# Patient Record
Sex: Male | Born: 1980 | Race: White
Health system: Southern US, Community
[De-identification: ages and names within clinical notes are randomized; demographics above are authoritative.]

## PROBLEM LIST (undated history)

## (undated) DIAGNOSIS — N200 Calculus of kidney: Secondary | ICD-10-CM

---

## 2019-01-04 ENCOUNTER — Encounter (HOSPITAL_COMMUNITY): Payer: Self-pay

## 2019-01-04 ENCOUNTER — Other Ambulatory Visit: Payer: Self-pay

## 2019-01-04 ENCOUNTER — Emergency Department (HOSPITAL_COMMUNITY): Payer: Self-pay

## 2019-01-04 ENCOUNTER — Emergency Department (HOSPITAL_COMMUNITY)
Admission: EM | Admit: 2019-01-04 | Discharge: 2019-01-04 | Disposition: A | Discharge status: Home / Self Care | Payer: Self-pay | Attending: Emergency Medicine | Admitting: Emergency Medicine

## 2019-01-04 DIAGNOSIS — R200 Anesthesia of skin: Secondary | ICD-10-CM | POA: Insufficient documentation

## 2019-01-04 DIAGNOSIS — R0600 Dyspnea, unspecified: Secondary | ICD-10-CM | POA: Insufficient documentation

## 2019-01-04 DIAGNOSIS — F17210 Nicotine dependence, cigarettes, uncomplicated: Secondary | ICD-10-CM | POA: Insufficient documentation

## 2019-01-04 LAB — CBC WITH DIFFERENTIAL/PLATELET
Abs Immature Granulocytes: 0.01 10*3/uL (ref 0.00–0.07)
Basophils Absolute: 0.1 10*3/uL (ref 0.0–0.1)
Basophils Relative: 1 %
Eosinophils Absolute: 0.1 10*3/uL (ref 0.0–0.5)
Eosinophils Relative: 1 %
HCT: 42.8 % (ref 39.0–52.0)
Hemoglobin: 13.7 g/dL (ref 13.0–17.0)
Immature Granulocytes: 0 %
Lymphocytes Relative: 26 %
Lymphs Abs: 2.1 10*3/uL (ref 0.7–4.0)
MCH: 28.9 pg (ref 26.0–34.0)
MCHC: 32 g/dL (ref 30.0–36.0)
MCV: 90.3 fL (ref 80.0–100.0)
Monocytes Absolute: 0.3 10*3/uL (ref 0.1–1.0)
Monocytes Relative: 4 %
Neutro Abs: 5.6 10*3/uL (ref 1.7–7.7)
Neutrophils Relative %: 68 %
Platelets: 230 10*3/uL (ref 150–400)
RBC: 4.74 MIL/uL (ref 4.22–5.81)
RDW: 13 % (ref 11.5–15.5)
WBC: 8.2 10*3/uL (ref 4.0–10.5)
nRBC: 0 % (ref 0.0–0.2)

## 2019-01-04 LAB — COMPREHENSIVE METABOLIC PANEL
ALT: 9 U/L (ref 0–44)
AST: 15 U/L (ref 15–41)
Albumin: 4.4 g/dL (ref 3.5–5.0)
Alkaline Phosphatase: 91 U/L (ref 38–126)
Anion gap: 13 (ref 5–15)
BUN: 8 mg/dL (ref 6–20)
CO2: 20 mmol/L — ABNORMAL LOW (ref 22–32)
Calcium: 8.9 mg/dL (ref 8.9–10.3)
Chloride: 107 mmol/L (ref 98–111)
Creatinine, Ser: 0.9 mg/dL (ref 0.61–1.24)
GFR calc Af Amer: >60 mL/min (ref >60)
GFR calc non Af Amer: >60 mL/min (ref >60)
Glucose, Bld: 93 mg/dL (ref 70–99)
Potassium: 3.7 mmol/L (ref 3.5–5.1)
Sodium: 140 mmol/L (ref 135–145)
Total Bilirubin: 0.5 mg/dL (ref 0.3–1.2)
Total Protein: 7.6 g/dL (ref 6.5–8.1)

## 2019-01-04 LAB — BRAIN NATRIURETIC PEPTIDE: B Natriuretic Peptide: 20.8 pg/mL (ref 0.0–100.0)

## 2019-01-04 LAB — TROPONIN I (HIGH SENSITIVITY)
Troponin I (High Sensitivity): 2 ng/L (ref <18)
Troponin I (High Sensitivity): 3 ng/L (ref <18)

## 2019-01-04 LAB — D-DIMER, QUANTITATIVE: D-Dimer, Quant: <0.27 ug/mL-FEU (ref 0.00–0.50)

## 2019-01-04 MED ORDER — KETOROLAC TROMETHAMINE 30 MG/ML IJ SOLN
30.0000 mg | Freq: Once | INTRAMUSCULAR | Status: AC
  Administered 2019-01-04: 30 mg via INTRAVENOUS
  Filled 2019-01-04: qty 1

## 2019-01-04 MED ORDER — SODIUM CHLORIDE (PF) 0.9 % IJ SOLN
INTRAMUSCULAR | Status: AC
  Administered 2019-01-04: 18:00:00 10 mL
  Filled 2019-01-04: qty 50

## 2019-01-04 MED ORDER — HYDROCODONE-ACETAMINOPHEN 5-325 MG PO TABS: 1.0000 | ORAL_TABLET | Freq: Four times a day (QID) | ORAL | 0 refills | Status: AC | PRN

## 2019-01-04 MED ORDER — IOHEXOL 350 MG/ML SOLN
100.0000 mL | Freq: Once | INTRAVENOUS | Status: AC | PRN
  Administered 2019-01-04: 100 mL via INTRAVENOUS

## 2019-01-04 MED ORDER — MORPHINE SULFATE (PF) 4 MG/ML IV SOLN
4.0000 mg | Freq: Once | INTRAVENOUS | Status: AC
  Administered 2019-01-04: 17:00:00 4 mg via INTRAVENOUS
  Filled 2019-01-04: qty 1

## 2019-01-04 MED ORDER — PREDNISONE 10 MG PO TABS: 20.0000 mg | ORAL_TABLET | Freq: Two times a day (BID) | ORAL | 0 refills | Status: AC

## 2019-01-04 NOTE — Discharge Instructions (Signed)
Prednisone as prescribed.  Hydrocodone as prescribed as needed for pain.  Return to the emergency department if you develop worsening breathing, severe chest pain, high fever, or other new and concerning symptoms.  The radiologist is recommending you have outpatient follow-up with a pulmonologist to further evaluate the abnormal finding on your CT scan.  The contact information for the pulmonology clinic has been provided in this discharge summary for you to call and make these arrangements.

## 2019-01-04 NOTE — ED Triage Notes (Signed)
He c/o shortness of breath and a "weird feeling of tingling starting at my left shoulder going down my left arm to my hand". His skin is normal, warm and dry and he is in no distress. EKG performed at triage.

## 2019-01-04 NOTE — ED Provider Notes (Signed)
[facility]-EMERGENCY DEPT Provider Note   CSN: [id number] Arrival date & time: 01/04/19  1127     History   Chief Complaint Chief Complaint  Patient presents with  . Shortness of Breath    HPI Travis Nelson is a 38 y.o. male.     Patient is a 38 year old male with no past medical history taking no medications.  He presents today for evaluation of chest discomfort and shortness of breath.  This began yesterday while he was outside smoking a cigarette.  He states that he suddenly began to feel short of breath along with numbness to the left chest and a tingling sensation going down his left arm.  He denies any nausea, diaphoresis, fevers, chills, or cough.  He denies any ill contacts.  He denies any swelling in his legs.  Patient has no prior cardiac history but does have cardiac risk factors of family history and tobacco use.  The history is provided by the patient.  Shortness of Breath Severity:  Moderate Onset quality:  Sudden Duration:  12 hours Timing:  Constant Progression:  Unchanged Chronicity:  New Relieved by:  Nothing Worsened by:  Nothing Ineffective treatments:  None tried Associated symptoms: no cough, no diaphoresis, no fever and no sputum production     No past medical history on file.  There are no active problems to display for this patient.       Home Medications    Prior to Admission medications   Not on File    Family History No family history on file.  Social History Social History   Tobacco Use  . Smoking status: Never Smoker  . Smokeless tobacco: Never Used  Substance Use Topics  . Alcohol use: Not on file  . Drug use: Not on file     Allergies   Patient has no known allergies.   Review of Systems Review of Systems  Constitutional: Negative for diaphoresis and fever.  Respiratory: Positive for shortness of breath. Negative for cough and sputum production.   All other systems reviewed and are negative.     Physical Exam Updated Vital Signs BP (!) 157/96 (BP Location: Right Arm)   Pulse 62   Temp 98.4 F (36.9 C) (Oral)   Resp 20   SpO2 100%   Physical Exam Vitals signs and nursing note reviewed.  Constitutional:      General: He is not in acute distress.    Appearance: He is well-developed. He is not diaphoretic.  HENT:     Head: Normocephalic and atraumatic.  Neck:     Musculoskeletal: Normal range of motion and neck supple.  Cardiovascular:     Rate and Rhythm: Normal rate and regular rhythm.     Heart sounds: No murmur. No friction rub.  Pulmonary:     Effort: Pulmonary effort is normal. No respiratory distress.     Breath sounds: Normal breath sounds. No wheezing or rales.  Abdominal:     General: Bowel sounds are normal. There is no distension.     Palpations: Abdomen is soft.     Tenderness: There is no abdominal tenderness.  Musculoskeletal: Normal range of motion.     Right lower leg: He exhibits no tenderness. No edema.     Left lower leg: He exhibits no tenderness. No edema.     Comments: Homans sign is absent bilaterally.  Skin:    General: Skin is warm and dry.  Neurological:     Mental Status: He is  alert and oriented to person, place, and time.     Coordination: Coordination normal.      ED Treatments / Results  Labs (all labs ordered are listed, but only abnormal results are displayed) Labs Reviewed  COMPREHENSIVE METABOLIC PANEL  CBC WITH DIFFERENTIAL/PLATELET  D-DIMER, QUANTITATIVE (NOT AT [facility])  TROPONIN I (HIGH SENSITIVITY)    EKG EKG Interpretation  Date/Time:  Sunday January 04 2019 11:39:19 EDT Ventricular Rate:  62 PR Interval:    QRS Duration: 92 QT Interval:  396 QTC Calculation: 403 R Axis:   64 Text Interpretation:  Sinus rhythm ST elev, probable normal early repol pattern Confirmed by Veryl Speak [phone]) on 01/04/2019 1:40:04 PM   Radiology No results found.  Procedures Procedures (including critical care time)   Medications Ordered in ED Medications  ketorolac (TORADOL) 30 MG/ML injection 30 mg (has no administration in time range)     Initial Impression / Assessment and Plan / ED Course  I have reviewed the triage vital signs and the nursing notes.  Pertinent labs & imaging results that were available during my care of the patient were reviewed by me and considered in my medical decision making (see chart for details).  Patient is a 38 year old male presenting here with complaints of shortness of breath and left arm numbness.  This began acutely this morning.  His work-up today is essentially unremarkable.  He has had negative troponin x2, unchanged EKG, and CT scan showing no evidence for pulmonary embolism, and no other acute process.  It does appear as though he has a dilated main pulmonary artery which can be seen in patients with elevated pulmonary artery pressures.  I do not believe this is the cause of his discomfort today.  Patient will be discharged with prednisone and pain medicine for what I suspect to be some sort of radiculopathy in his left arm.  I am uncertain about the etiology of his shortness of breath, but nothing appears emergent.  His vitals are stable and oxygen saturations are reassuring.  Final Clinical Impressions(s) / ED Diagnoses   Final diagnoses:  None    ED Discharge Orders    None       Veryl Speak, MD 01/04/19 [phone]

## 2019-12-30 IMAGING — DX PORTABLE CHEST - 1 VIEW
2 series · 2 of 2 positions shown · non-contrast
Comparison: None.

CLINICAL DATA: Left chest pain in the region of the left pectoral
region with deep inspiration. Numbness from the left axilla to the
left fingers.

EXAM:
PORTABLE CHEST 1 VIEW

[chest ap (1 of 2)]
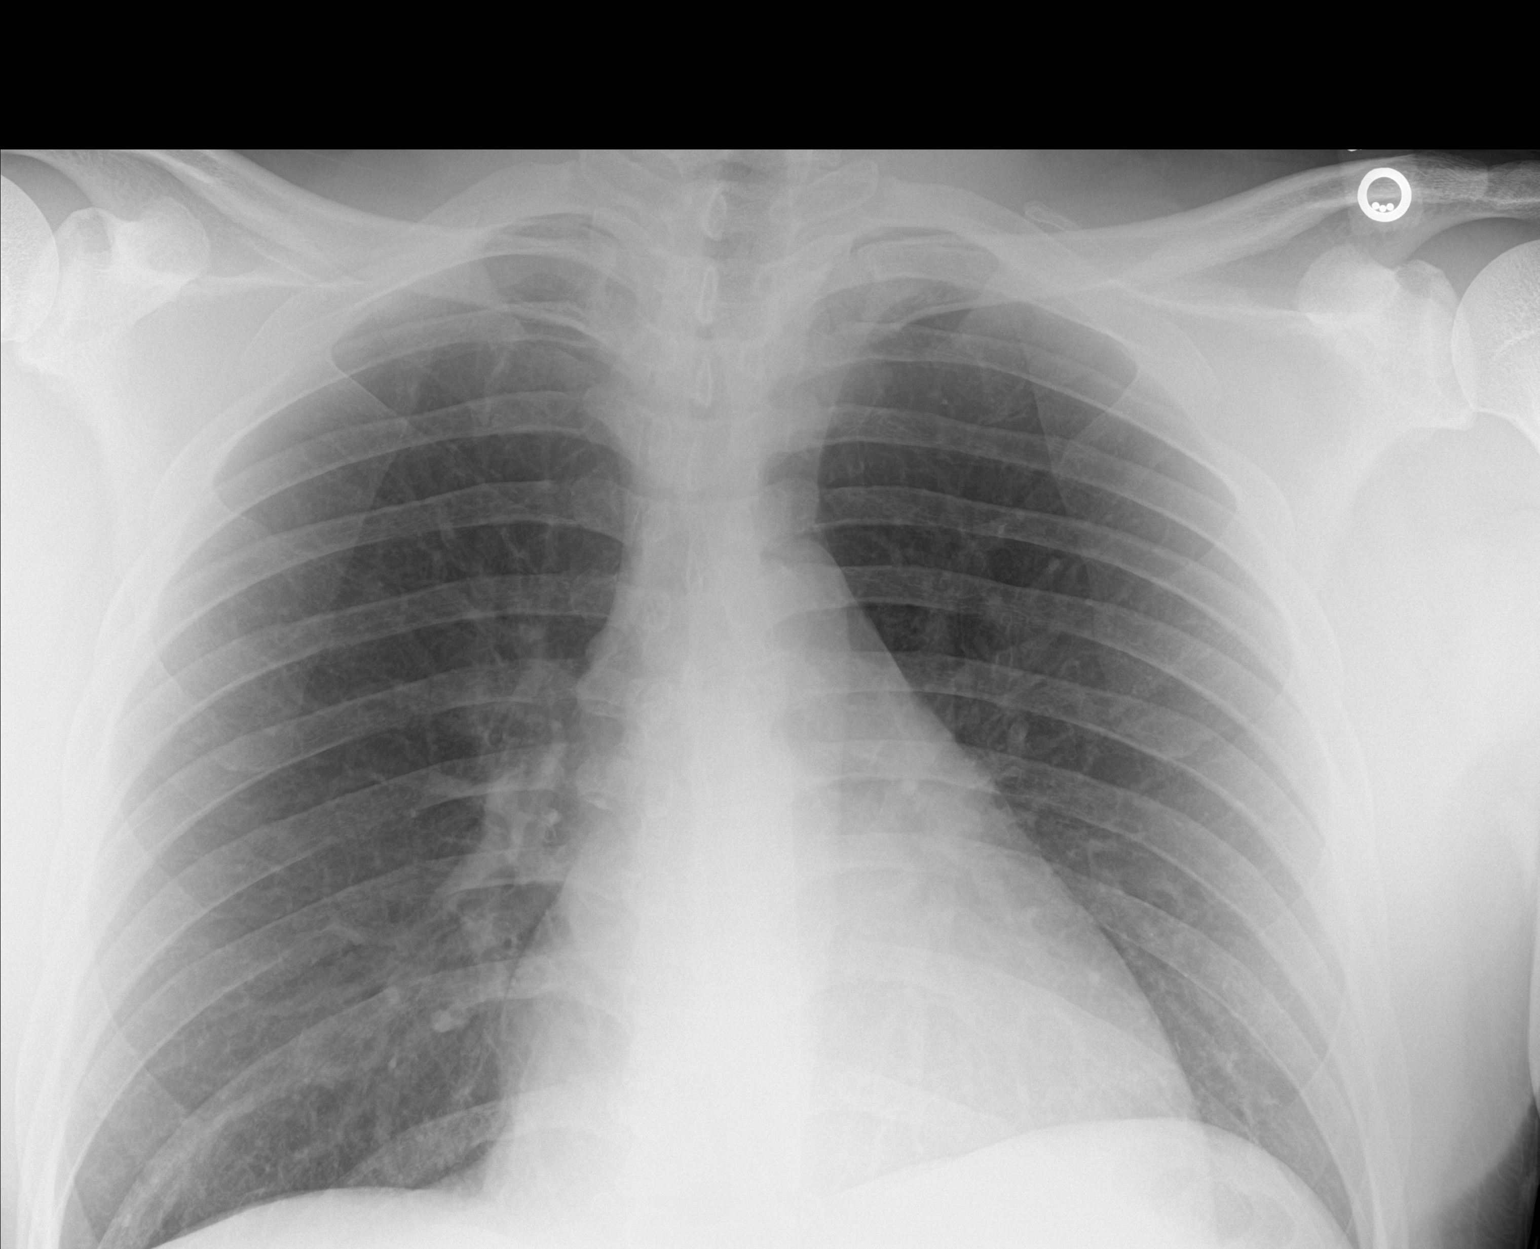

[chest ap (2 of 2)]
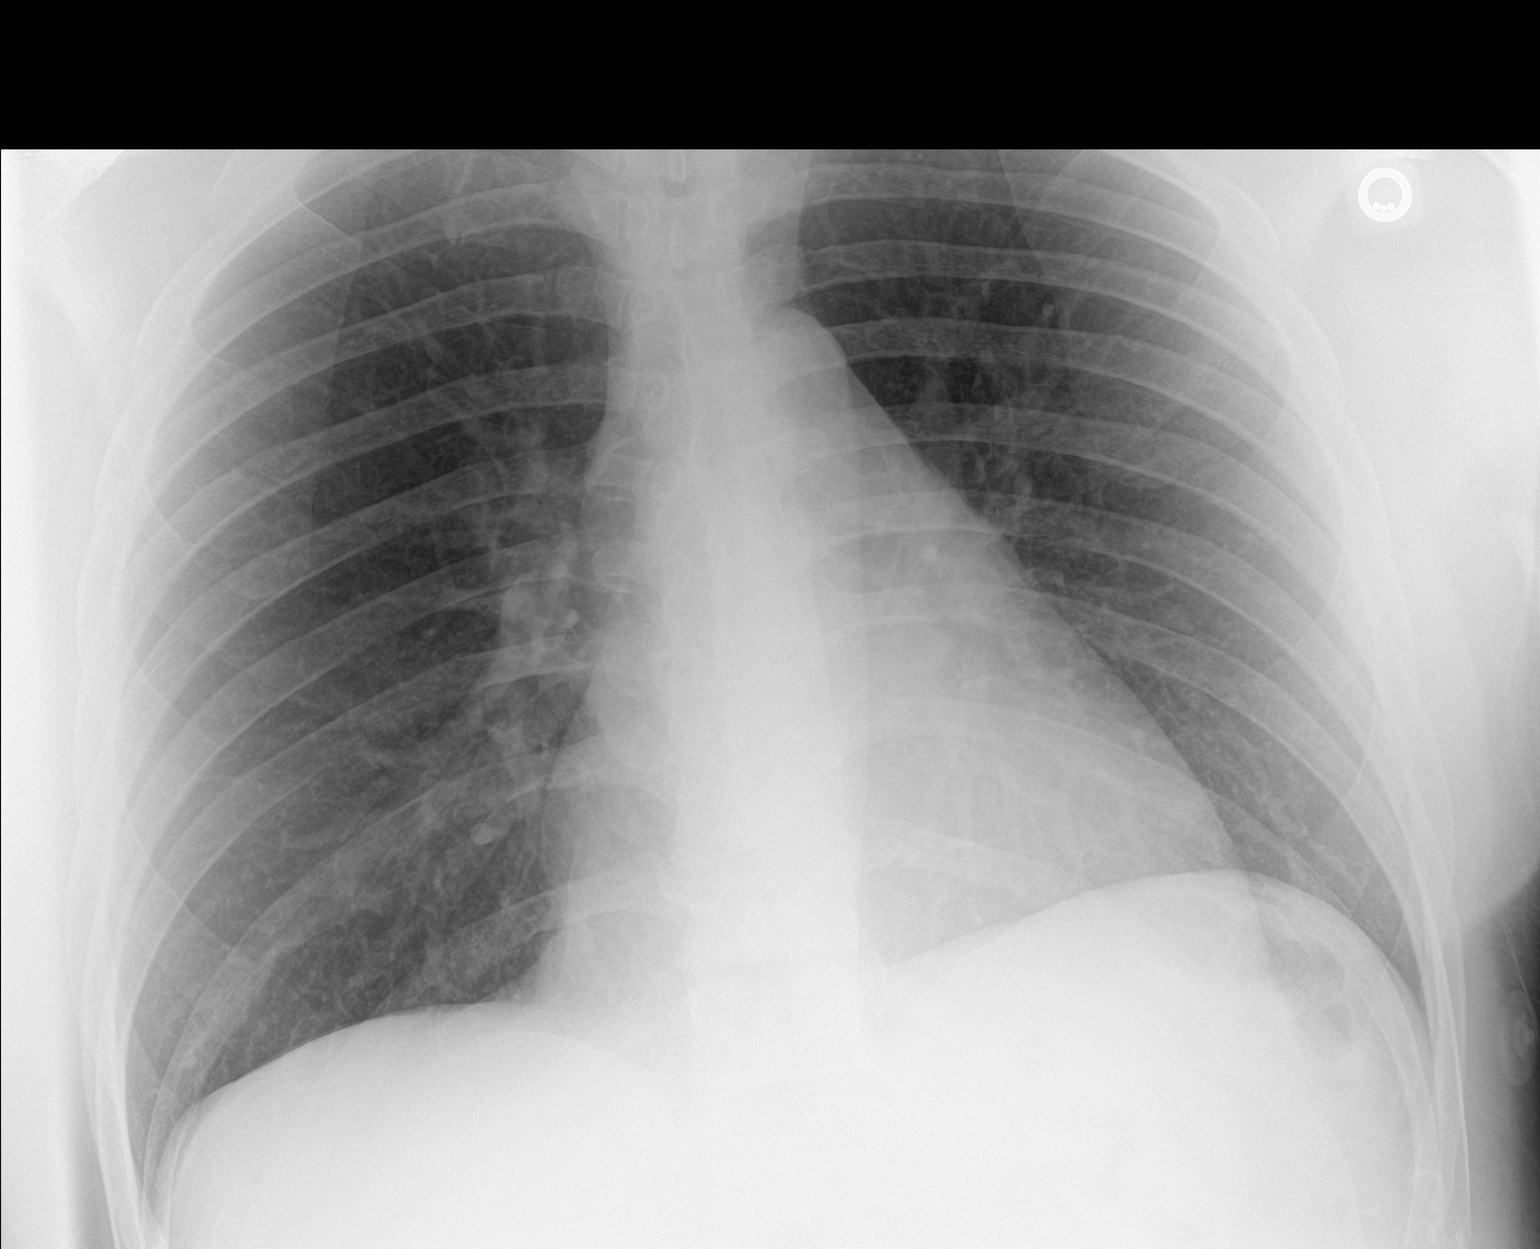

[2 of 2 positions shown; findings below may reference images not displayed]

FINDINGS: Mildly enlarged cardiac silhouette and cephalization of the
pulmonary blood flow. Clear lungs. Unremarkable bones.
IMPRESSION: Mild cardiomegaly and mild pulmonary vascular congestion.

## 2020-12-19 ENCOUNTER — Emergency Department: Payer: TRICARE Prime—HMO

## 2020-12-19 ENCOUNTER — Emergency Department
Admission: EM | Admit: 2020-12-19 | Discharge: 2020-12-19 | Disposition: A | Discharge status: Home / Self Care | Payer: TRICARE Prime—HMO | Attending: Emergency Medicine | Admitting: Emergency Medicine

## 2020-12-19 DIAGNOSIS — R110 Nausea: Secondary | ICD-10-CM | POA: Insufficient documentation

## 2020-12-19 DIAGNOSIS — N200 Calculus of kidney: Secondary | ICD-10-CM

## 2020-12-19 DIAGNOSIS — N132 Hydronephrosis with renal and ureteral calculous obstruction: Secondary | ICD-10-CM | POA: Insufficient documentation

## 2020-12-19 DIAGNOSIS — Z87442 Personal history of urinary calculi: Secondary | ICD-10-CM | POA: Insufficient documentation

## 2020-12-19 HISTORY — DX: Calculus of kidney: N20.0

## 2020-12-19 LAB — URINALYSIS REFLEX TO MICROSCOPIC EXAM - REFLEX TO CULTURE
Bilirubin, UA: NEGATIVE
Glucose, UA: NEGATIVE
Nitrite, UA: NEGATIVE
Protein, UR: 30 — AB
Specific Gravity UA: 1.025 (ref 1.001–1.035)
Urine pH: 6 (ref 5.0–8.0)
Urobilinogen, UA: NEGATIVE mg/dL (ref 0.2–2.0)

## 2020-12-19 LAB — CBC AND DIFFERENTIAL
Absolute NRBC: 0 10*3/uL (ref 0.00–0.00)
Basophils Absolute Automated: 0.02 10*3/uL (ref 0.00–0.08)
Basophils Automated: 0.2 %
Eosinophils Absolute Automated: 0.01 10*3/uL (ref 0.00–0.44)
Eosinophils Automated: 0.1 %
Hematocrit: 36 % — ABNORMAL LOW (ref 37.6–49.6)
Hgb: 12.9 g/dL (ref 12.5–17.1)
Immature Granulocytes Absolute: 0.05 10*3/uL (ref 0.00–0.07)
Immature Granulocytes: 0.4 %
Lymphocytes Absolute Automated: 0.6 10*3/uL (ref 0.42–3.22)
Lymphocytes Automated: 5.2 %
MCH: 30.7 pg (ref 25.1–33.5)
MCHC: 35.8 g/dL (ref 31.5–35.8)
MCV: 85.7 fL (ref 78.0–96.0)
MPV: 9.6 fL (ref 8.9–12.5)
Monocytes Absolute Automated: 0.43 10*3/uL (ref 0.21–0.85)
Monocytes: 3.7 %
Neutrophils Absolute: 10.51 10*3/uL — ABNORMAL HIGH (ref 1.10–6.33)
Neutrophils: 90.4 %
Nucleated RBC: 0 /100 WBC (ref 0.0–0.0)
Platelets: 165 10*3/uL (ref 142–346)
RBC: 4.2 10*6/uL (ref 4.20–5.90)
RDW: 13 % (ref 11–15)
WBC: 11.62 10*3/uL — ABNORMAL HIGH (ref 3.10–9.50)

## 2020-12-19 LAB — COMPREHENSIVE METABOLIC PANEL
ALT: 17 U/L (ref 0–55)
AST (SGOT): 24 U/L (ref 5–34)
Albumin/Globulin Ratio: 1.6 (ref 0.9–2.2)
Albumin: 4.4 g/dL (ref 3.5–5.0)
Alkaline Phosphatase: 104 U/L (ref 37–117)
Anion Gap: 9 (ref 5.0–15.0)
BUN: 19 mg/dL (ref 9.0–28.0)
Bilirubin, Total: 0.9 mg/dL (ref 0.2–1.2)
CO2: 22 mEq/L (ref 22–29)
Calcium: 9.6 mg/dL (ref 8.5–10.5)
Chloride: 106 mEq/L (ref 100–111)
Creatinine: 1.3 mg/dL (ref 0.7–1.3)
Globulin: 2.7 g/dL (ref 2.0–3.6)
Glucose: 148 mg/dL — ABNORMAL HIGH (ref 70–100)
Potassium: 4 mEq/L (ref 3.5–5.1)
Protein, Total: 7.1 g/dL (ref 6.0–8.3)
Sodium: 137 mEq/L (ref 136–145)

## 2020-12-19 LAB — GFR: EGFR: >60

## 2020-12-19 LAB — LIPASE: Lipase: 22 U/L (ref 8–78)

## 2020-12-19 MED ORDER — AMLODIPINE BESYLATE 5 MG PO TABS
10.0000 mg | ORAL_TABLET | Freq: Once | ORAL | Status: DC
Start: 2020-12-19 — End: 2020-12-19

## 2020-12-19 MED ORDER — ONDANSETRON 4 MG PO TBDP
4.0000 mg | ORAL_TABLET | Freq: Four times a day (QID) | ORAL | 0 refills | Status: AC | PRN
Start: 2020-12-19 — End: ?

## 2020-12-19 MED ORDER — KETOROLAC TROMETHAMINE 30 MG/ML IJ SOLN
30.0000 mg | Freq: Once | INTRAMUSCULAR | Status: AC
Start: 2020-12-19 — End: 2020-12-19
  Administered 2020-12-19: 30 mg via INTRAVENOUS
  Filled 2020-12-19: qty 1

## 2020-12-19 MED ORDER — OXYCODONE-ACETAMINOPHEN 5-325 MG PO TABS
1.0000 | ORAL_TABLET | Freq: Three times a day (TID) | ORAL | 0 refills | Status: AC | PRN
Start: 2020-12-19 — End: 2020-12-26

## 2020-12-19 MED ORDER — CARVEDILOL 3.125 MG PO TABS
3.1250 mg | ORAL_TABLET | Freq: Once | ORAL | Status: DC
Start: 2020-12-19 — End: 2020-12-19

## 2020-12-19 MED ORDER — LOSARTAN POTASSIUM 25 MG PO TABS
50.0000 mg | ORAL_TABLET | Freq: Once | ORAL | Status: DC
Start: 2020-12-19 — End: 2020-12-19

## 2020-12-19 MED ORDER — KETOROLAC TROMETHAMINE 10 MG PO TABS
10.0000 mg | ORAL_TABLET | Freq: Four times a day (QID) | ORAL | 0 refills | Status: AC | PRN
Start: 2020-12-19 — End: ?

## 2020-12-19 MED ORDER — QUETIAPINE FUMARATE 100 MG PO TABS
100.0000 mg | ORAL_TABLET | Freq: Once | ORAL | Status: DC
Start: 2020-12-19 — End: 2020-12-19

## 2020-12-19 MED ORDER — HYDROCHLOROTHIAZIDE 25 MG PO TABS
25.0000 mg | ORAL_TABLET | Freq: Once | ORAL | Status: DC
Start: 2020-12-19 — End: 2020-12-19

## 2020-12-19 MED ORDER — MORPHINE SULFATE 2 MG/ML IJ/IV SOLN (WRAP)
2.0000 mg | Freq: Once | Status: DC
Start: 2020-12-19 — End: 2020-12-20
  Filled 2020-12-19: qty 1

## 2020-12-19 NOTE — ED Notes (Signed)
Pt provided with urine strainer and specimen cups.

## 2020-12-19 NOTE — ED Triage Notes (Signed)
Mark Reynolds is a 40 y.o. male presenting to the ED for acute moderate-severe right sided flank pain that began today at 330 pm, has been able to drink fluids, but began to vomit 1 hr ago.

## 2020-12-20 NOTE — ED Provider Notes (Signed)
EMERGENCY DEPARTMENT HISTORY AND PHYSICAL EXAM     None        Date: 12/19/2020  Patient Name: Mark Reynolds    History of Presenting Illness     Chief Complaint   Patient presents with    Flank Pain       History Provided By: patient    History: Mark Reynolds is a 40 y.o. male presenting to the ED with severe, sudden onset, aching/stabbing right flank pain radiating towards the right lower abdomen which started suddenly this afternoon.  Patient had associated nausea.  Symptoms are intermittent.  No clear exacerbating relieving factors to symptoms.  Patient had similar symptoms 5 years ago any first had a kidney stone.  No fever or chills.  Patient is generally healthy otherwise.      PCP: Pcp, None, MD  SPECIALISTS:    Current Facility-Administered Medications   Medication Dose Route Frequency Provider Last Rate Last Admin    morphine injection 2 mg  2 mg Intravenous Once Maryella Shivers, MD         Current Outpatient Medications   Medication Sig Dispense Refill    ketorolac (TORADOL) 10 MG tablet Take 1 tablet (10 mg total) by mouth every 6 (six) hours as needed for Pain 12 tablet 0    ondansetron (ZOFRAN-ODT) 4 MG disintegrating tablet Take 1 tablet (4 mg total) by mouth every 6 (six) hours as needed for Nausea 8 tablet 0    oxyCODONE-acetaminophen (PERCOCET) 5-325 MG per tablet Take 1 tablet by mouth 3 (three) times daily as needed for Pain 6 tablet 0       Past History     Past Medical History:  Past Medical History:   Diagnosis Date    Kidney stone        Past Surgical History:  Past Surgical History:   Procedure Laterality Date    ARTHROSCOPIC REPAIR ACL      TONSILLECTOMY         Family History:  History reviewed. No pertinent family history.    Social History:  Social History     Tobacco Use    Smoking status: Never    Smokeless tobacco: Never   Substance Use Topics    Alcohol use: Never    Drug use: Never       Allergies:  No Known Allergies    Review of Systems     Review of Systems: All other systems  reviewed and negative.         Physical Exam   BP 118/65   Pulse (!) 49   Temp 98.1 F (36.7 C)   Resp 18   Wt 80.7 kg   SpO2 100%     Constitutional: Vital signs reviewed.  Athletic appearing.  Appears uncomfortable.  Head: Normocephalic, atraumatic  Eyes: Conjunctiva and sclera are normal.  No injection or discharge.  Ears, Nose, Throat:  Normal external examination of the nose and ears. No throat or oropharyngeal swelling or erythema. Midline uvula. Mucous membranes moist.  Neck: Normal range of motion. Supple, no meningeal signs. Trachea midline. No stridor. No JVD  Respiratory/Chest: Clear to auscultation. No respiratory distress.   Cardiovascular: Regular rate and rhythm. No murmurs.  Abdomen:  Bowel sounds intact. No rebound or guarding. Soft.  Non-tender.  Back: + R CVA tenderness to percussion. No focal left CVA tenderness.  Upper Extremity:  No edema. No cyanosis. Bilateral radial pulses intact and equal.   Lower Extremity:  No  edema. No cyanosis.   Skin: Warm and dry. No rash.  Neuro: Cranial nerves grossly intact.  Moves all extremities spontaneously.  Psychiatric: Very pleasant.  Normal affect.  Normal insight.      Diagnostic Study Results     Labs -     Results       Procedure Component Value Units Date/Time    Urinalysis Reflex to Microscopic Exam- Reflex to Culture [[id number]]  (Abnormal) Collected: 12/19/20 2253    Specimen: Urine, Clean Catch Updated: 12/19/20 2308     Urine Type Urine, Clean Ca     Color, UA Yellow     Clarity, UA Clear     Specific Gravity UA 1.025     Urine pH 6.0     Leukocyte Esterase, UA Trace     Nitrite, UA Negative     Protein, UR 30     Glucose, UA Negative     Ketones UA Trace     Urobilinogen, UA Negative mg/dL      Bilirubin, UA Negative     Blood, UA Moderate     RBC, UA TNTC /hpf      WBC, UA 0 - 5 /hpf      Urine Mucus Present    Comprehensive metabolic panel [[id number]]  (Abnormal) Collected: 12/19/20 2048    Specimen: Blood Updated: 12/19/20 2107      Glucose 148 mg/dL      BUN 78.2 mg/dL      Creatinine 1.3 mg/dL      Sodium 956 mEq/L      Potassium 4.0 mEq/L      Chloride 106 mEq/L      CO2 22 mEq/L      Calcium 9.6 mg/dL      Protein, Total 7.1 g/dL      Albumin 4.4 g/dL      AST (SGOT) 24 U/L      ALT 17 U/L      Alkaline Phosphatase 104 U/L      Bilirubin, Total 0.9 mg/dL      Globulin 2.7 g/dL      Albumin/Globulin Ratio 1.6     Anion Gap 9.0    Lipase [[id number]] Collected: 12/19/20 2048    Specimen: Blood Updated: 12/19/20 2107     Lipase 22 U/L     GFR [[id number]] Collected: 12/19/20 2048     Updated: 12/19/20 2107     EGFR >60.0       CBC and differential [[id number]]  (Abnormal) Collected: 12/19/20 2048    Specimen: Blood Updated: 12/19/20 2055     WBC 11.62 x10 3/uL      Hgb 12.9 g/dL      Hematocrit 27.2 %      Platelets 165 x10 3/uL      RBC 4.20 x10 6/uL      MCV 85.7 fL      MCH 30.7 pg      MCHC 35.8 g/dL      RDW 13 %      MPV 9.6 fL      Neutrophils 90.4 %      Lymphocytes Automated 5.2 %      Monocytes 3.7 %      Eosinophils Automated 0.1 %      Basophils Automated 0.2 %      Immature Granulocytes 0.4 %      Nucleated RBC 0.0 /100 WBC      Neutrophils Absolute 10.51 x10 3/uL      Lymphocytes  Absolute Automated 0.60 x10 3/uL      Monocytes Absolute Automated 0.43 x10 3/uL      Eosinophils Absolute Automated 0.01 x10 3/uL      Basophils Absolute Automated 0.02 x10 3/uL      Immature Granulocytes Absolute 0.05 x10 3/uL      Absolute NRBC 0.00 x10 3/uL             Radiologic Studies -   Radiology Results (24 Hour)       Procedure Component Value Units Date/Time    CT Abd/Pelvis without Contrast [[id number]] Collected: 12/19/20 2244    Order Status: Completed Updated: 12/19/20 2248    Narrative:      Clinical History:  severe R flank pain r/o stone    Examination:  CT scan of the abdomen and pelvis without oral or intravenous contrast.  Sagittal and coronal reformatted images were also submitted for review.    CT images were acquired utilizing  Automated Exposure Control for dose  reduction.     Comparison:  None available.    Findings:    Limitations: Evaluation of the solid organs and vessels may be limited  by the absence of intravenous contrast.    Lower chest:     The lung bases are clear. There is no pleural effusion. The heart is  normal in size. The distal esophagus is unremarkable.    Abdomen and Pelvis:    The liver, gallbladder, pancreas and spleen are unremarkable. There is  no biliary duct dilatation. The adrenal glands are unremarkable. There  is bilateral nephrolithiasis. There is mild right hydroureteronephrosis,  with a 3 mm calculus within the distal ureter. The bladder is  unremarkable. There is no ascites or free intraperitoneal gas. The bowel  is of normal luminal diameter. The appendix is normal. There is no  diverticulitis or colitis. The abdominal aorta and IVC are unremarkable.  There is no lymphadenopathy.    Soft tissues and Osseous Structures:    No acute osseous abnormalities are seen. The soft tissues are within  normal limits.      Impression:          Bilateral nephrolithiasis, with mild right hydroureteronephrosis, and a  3 mm calculus within the distal ureter.    Normal appendix.    Alric Seton MD, MD   12/19/2020 10:46 PM        .    Medical Decision Making   I am the first provider for this patient.    I reviewed the vital signs, available nursing notes, past medical history, past surgical history, family history and social history.    Vital Signs-Reviewed the patient's vital signs.   Patient Vitals for the past 12 hrs:   BP Temp Pulse Resp   12/19/20 2300 118/65 -- (!) 49 --   12/19/20 2249 116/63 98.1 F (36.7 C) (!) 51 18   12/19/20 2043 170/76 97.7 F (36.5 C) 60 20            ED Course:      1104 -patient feeling much better.  Pain completely controlled.  He would like to go home. Discussed results with pt and counseled on diagnosis, f/u plans, medication use, supportive care, and signs and symptoms when to return  to ED immediately. Pt voices understanding and agreement with plan. All questions and concerns addressed.         Provider Notes: Patient presenting to ED with kidney stone.  Pain controlled.  Given prescription  for pain and nausea meds.  He will follow-up on outpatient basis with urology.  Strict return precautions.          Diagnosis     Clinical Impression:   1. Kidney stone        Treatment Plan:   ED Disposition       ED Disposition   Discharge    Condition   --    Date/Time   Mon Dec 19, 2020 11:04 PM    Comment   TABIAS SWAYZE discharge to home/self care.    Condition at disposition: Stable                   _______________________________    This note was generated by the Epic EMR system/ Dragon speech recognition and may contain inherent errors or omissions not intended by the user. Grammatical errors, random word insertions, deletions and pronoun errors  are occasional consequences of this technology due to software limitations. Not all errors are caught or corrected. If there are questions or concerns about the content of this note or information contained within the body of this dictation they should be addressed directly with the author for clarification.      Attestations: This note is prepared by Lynnea Ferrier, MD    _______________________________       Maryella Shivers, MD  12/20/20 [phone]
